# Patient Record
Sex: Male | Born: 1980 | Race: Black or African American | Hispanic: No | Marital: Single | Smoking: Never smoker
Health system: Southern US, Community
[De-identification: ages and names within clinical notes are randomized; demographics above are authoritative.]

---

## 2016-03-20 ENCOUNTER — Emergency Department (HOSPITAL_COMMUNITY): Payer: Self-pay

## 2016-03-20 ENCOUNTER — Emergency Department (HOSPITAL_COMMUNITY)
Admission: EM | Admit: 2016-03-20 | Discharge: 2016-03-20 | Disposition: A | Discharge status: Home / Self Care | Payer: Self-pay | Attending: Emergency Medicine | Admitting: Emergency Medicine

## 2016-03-20 ENCOUNTER — Encounter (HOSPITAL_COMMUNITY): Payer: Self-pay

## 2016-03-20 DIAGNOSIS — Y9289 Other specified places as the place of occurrence of the external cause: Secondary | ICD-10-CM | POA: Insufficient documentation

## 2016-03-20 DIAGNOSIS — M79671 Pain in right foot: Secondary | ICD-10-CM | POA: Insufficient documentation

## 2016-03-20 DIAGNOSIS — Y990 Civilian activity done for income or pay: Secondary | ICD-10-CM | POA: Insufficient documentation

## 2016-03-20 DIAGNOSIS — Y9371 Activity, boxing: Secondary | ICD-10-CM | POA: Insufficient documentation

## 2016-03-20 DIAGNOSIS — X509XXA Other and unspecified overexertion or strenuous movements or postures, initial encounter: Secondary | ICD-10-CM | POA: Insufficient documentation

## 2016-03-20 DIAGNOSIS — M25562 Pain in left knee: Secondary | ICD-10-CM | POA: Insufficient documentation

## 2016-03-20 MED ORDER — IBUPROFEN 800 MG PO TABS: 800.0000 mg | ORAL_TABLET | Freq: Three times a day (TID) | ORAL | 0 refills | Status: AC

## 2016-03-20 MED ORDER — ACETAMINOPHEN 500 MG PO TABS
1000.0000 mg | ORAL_TABLET | Freq: Once | ORAL | Status: AC
  Administered 2016-03-20: 1000 mg via ORAL
  Filled 2016-03-20: qty 2

## 2016-03-20 NOTE — ED Provider Notes (Addendum)
[facility]-EMERGENCY DEPT Provider Note   CSN: [id number] Arrival date & time: 03/20/16  0200  By signing my name below, I, Soijett Blue, attest that this documentation has been prepared under the direction and in the presence of Rayon Mcchristian, MD. Electronically Signed: Soijett Blue, ED Scribe. 03/20/16. 2:36 AM.    History   Chief Complaint Chief Complaint  Patient presents with  . Knee Injury  . Foot Pain    HPI Carl Cooper is a 35 y.o. male who presents to the Emergency Department complaining of left knee injury occurring 12 hours ago PTA. Pt states that he was boxing with friends when he made a sudden move and heard his left knee popped. Pt is having associated symptoms of right foot pain. He notes that he has not tried any medications for the relief of his symptoms. He denies color change, wound, rash, swelling, and any other symptoms.    The history is provided by the patient. No language interpreter was used.  Foot Pain  This is a new problem. The current episode started 12 to 24 hours ago. The problem occurs rarely. The problem has not changed since onset.Pertinent negatives include no chest pain. Nothing aggravates the symptoms. Nothing relieves the symptoms. He has tried nothing for the symptoms. The treatment provided no relief.  Knee Pain   This is a new problem. The current episode started 12 to 24 hours ago. The problem occurs rarely. The problem has not changed since onset.The pain is present in the left knee. Pertinent negatives include full range of motion. He has tried nothing for the symptoms. The treatment provided no relief.    History reviewed. No pertinent past medical history.  There are no active problems to display for this patient.   History reviewed. No pertinent surgical history.     Home Medications    Prior to Admission medications   Not on File    Family History No family history on file.  Social History Social History  Substance Use  Topics  . Smoking status: Never Smoker  . Smokeless tobacco: Never Used  . Alcohol use No     Allergies   Review of patient's allergies indicates no known allergies.   Review of Systems Review of Systems  Cardiovascular: Negative for chest pain.  Musculoskeletal: Positive for arthralgias (left knee and right foot). Negative for joint swelling.  Skin: Negative for color change, rash and wound.  All other systems reviewed and are negative.    Physical Exam Updated Vital Signs BP 130/95   Pulse (!) 125   Temp 98 F (36.7 C)   Resp 18   Ht 5\' 11"  (1.803 m)   Wt 222 lb (100.7 kg)   SpO2 97%   BMI 30.96 kg/m   Physical Exam  Constitutional: He is oriented to person, place, and time. He appears well-developed and well-nourished. No distress.  HENT:  Head: Normocephalic and atraumatic.  Mouth/Throat: Oropharynx is clear and moist. No oropharyngeal exudate.  Eyes: Conjunctivae and EOM are normal. Pupils are equal, round, and reactive to light.  Neck: Trachea normal. Neck supple. No JVD present. Carotid bruit is not present. No tracheal deviation present.  Cardiovascular: Normal rate, regular rhythm and normal heart sounds.  Exam reveals no gallop and no friction rub.   No murmur heard. Pulmonary/Chest: Effort normal and breath sounds normal. No stridor. No respiratory distress. He has no wheezes. He has no rales.  Abdominal: Soft. Bowel sounds are normal. He exhibits no distension and  no mass. There is no tenderness. There is no rebound and no guarding.  Musculoskeletal: Normal range of motion.       Right hip: Normal.       Left hip: Normal.       Left knee: Normal. He exhibits normal range of motion, no swelling, no effusion, no ecchymosis, no deformity, no laceration, no erythema, normal alignment, no LCL laxity, no bony tenderness, normal meniscus and no MCL laxity. No tenderness found. No medial joint line, no lateral joint line, no MCL, no LCL and no patellar tendon  tenderness noted.       Right ankle: He exhibits no deformity. Achilles tendon normal.       Right lower leg: Normal.       Left lower leg: Normal.  No left tibial plateau tenderness. Patella and quadriceps tendons intact. No laxity. Negative valgus or varus. Anterior and posterior drawer test negative. Intact achilles tendon. Intact plantar fascia. No right foot deformity. Pelvis stable. No fore-shortening or external rotation.   Neurological: He is alert and oriented to person, place, and time. He has normal reflexes.  DTR's intact.   Skin: Skin is warm and dry. Capillary refill takes less than 2 seconds. He is not diaphoretic.  Psychiatric: He has a normal mood and affect. His behavior is normal.  Nursing note and vitals reviewed.    ED Treatments / Results  DIAGNOSTIC STUDIES: Oxygen Saturation is 97% on RA, nl by my interpretation.    COORDINATION OF CARE: 2:32 AM Discussed treatment plan with pt at bedside which includes right knee xray, left foot xray, and pt agreed to plan.   Labs (all labs ordered are listed, but only abnormal results are displayed) Labs Reviewed - No data to display  Vitals:   03/20/16 0210  BP: 130/95  Pulse: (!) 125  Resp: 18  Temp: 98 F (36.7 C)    Radiology Dg Knee Complete 4 Views Left  Result Date: 03/20/2016 CLINICAL DATA:  Hurt left knee in a fall. EXAM: LEFT KNEE - COMPLETE 4+ VIEW COMPARISON:  None. 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 No evidence of acute fracture or dislocation in the left knee. No focal bone lesions. No significant effusion. 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  No acute bony abnormalities. Electronically Signed   By: Burman Nieves M.D.   On: 03/20/2016 02:36   Dg Foot Complete Right  Result Date: 03/20/2016 CLINICAL DATA:  Hurt right foot in a fall. EXAM: RIGHT FOOT COMPLETE - 3+ VIEW COMPARISON:  None. FINDINGS: There is no evidence of  fracture or dislocation. There is no evidence of arthropathy or other focal bone abnormality. Soft tissues are unremarkable. IMPRESSION: Negative. Electronically Signed   By: Burman Nieves M.D.   On: 03/20/2016 02:38    Procedures Procedures (including critical care time)  Medications Ordered in ED Medications - No data to display   Initial Impression / Assessment and Plan / ED Course  I have reviewed the triage vital signs and the nursing notes.  Pertinent imaging results that were available during my care of the patient were reviewed by me and considered in my medical decision making (see chart for details).  Clinical Course     Final Clinical Impressions(s) / ED Diagnoses   Final diagnoses:  None    New Prescriptions New Prescriptions   No medications on file   All questions answered to patient's satisfaction. Based on history and exam patient has been  appropriately medically screened and emergency conditions excluded. Patient is stable for discharge at this time. Follow up with your PMD for recheck in 2 days and strict return precautions given.   I personally performed the services described in this documentation, which was scribed in my presence. The recorded information has been reviewed and is accurate.       Cy BlamerApril Pharaoh Pio, MD 03/20/16 0245    Loa Idler, MD 03/20/16 [id number]

## 2016-03-20 NOTE — ED Triage Notes (Signed)
Pt states that he was playing around with some friends at work and hurt his L knee and R  Foot. Able to move extremities without difficulty, no swelling noted.

## 2018-01-25 IMAGING — DX DG FOOT COMPLETE 3+V*R*
3 series · 3 of 3 positions shown · non-contrast
Comparison: None.

CLINICAL DATA: Hurt right foot in a fall.

EXAM:
RIGHT FOOT COMPLETE - 3+ VIEW

[foot ap]
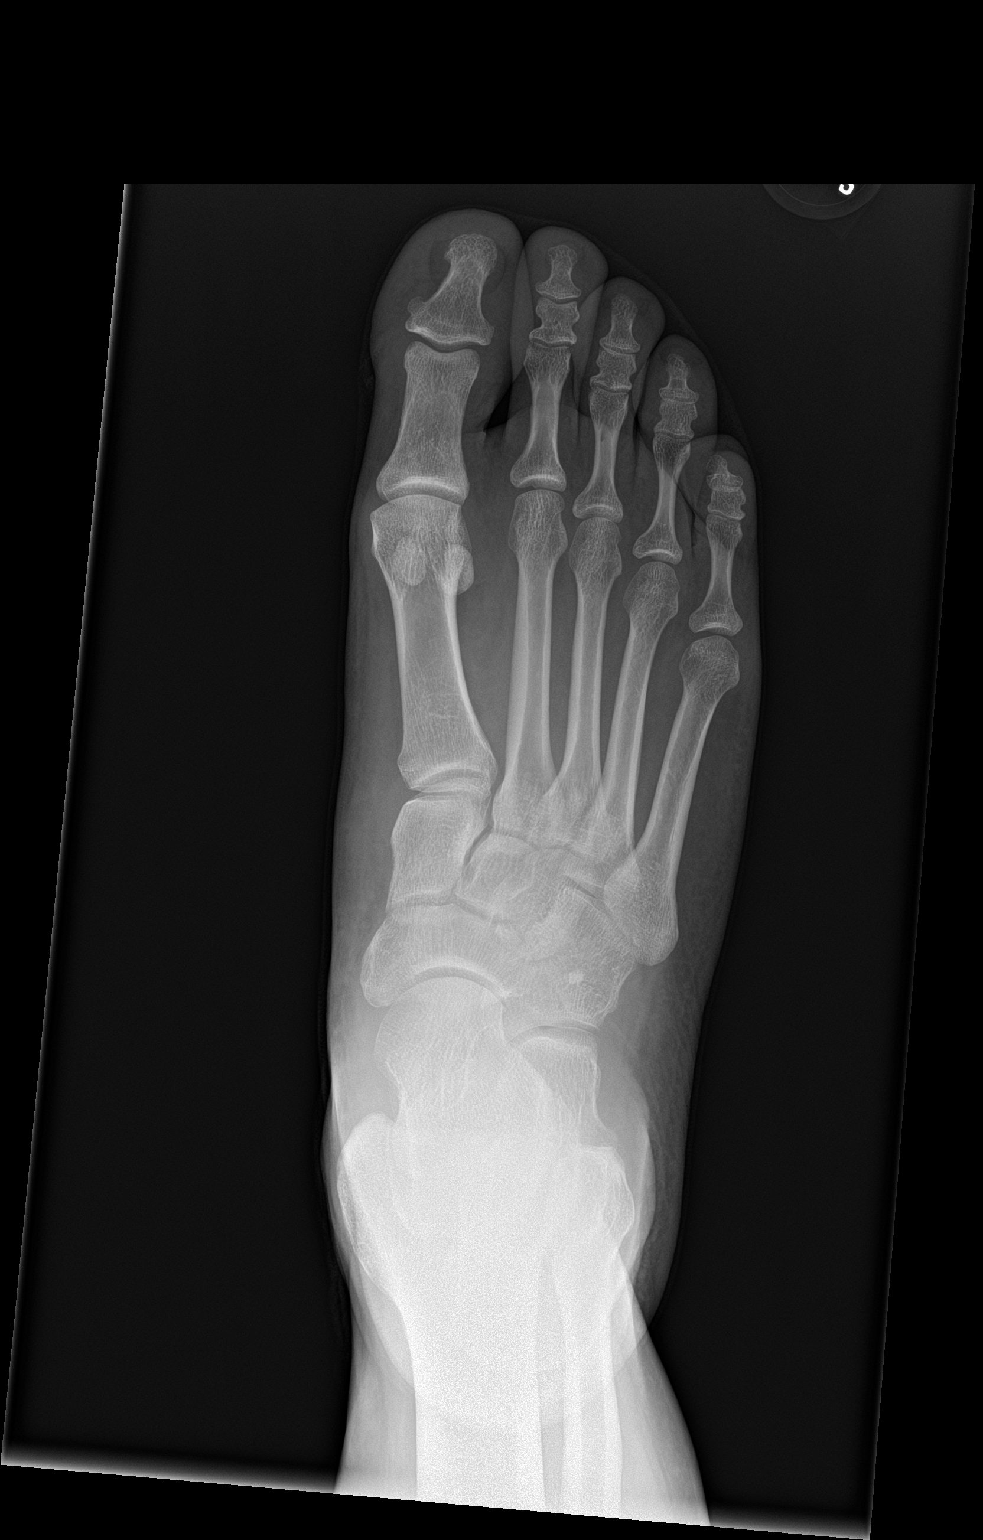

[foot obl]
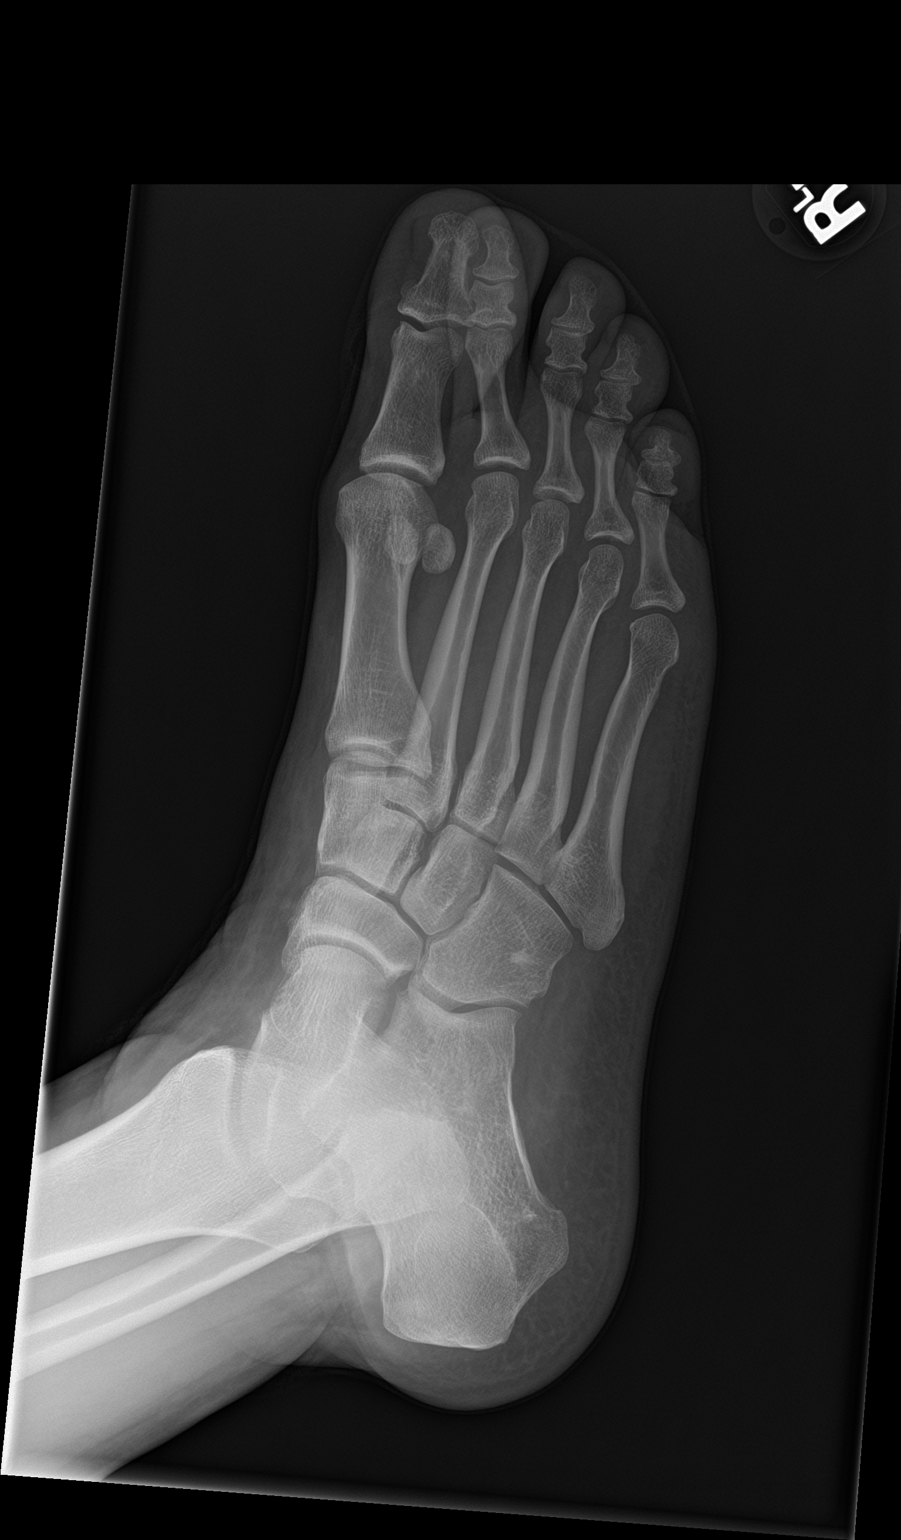

[foot lat]
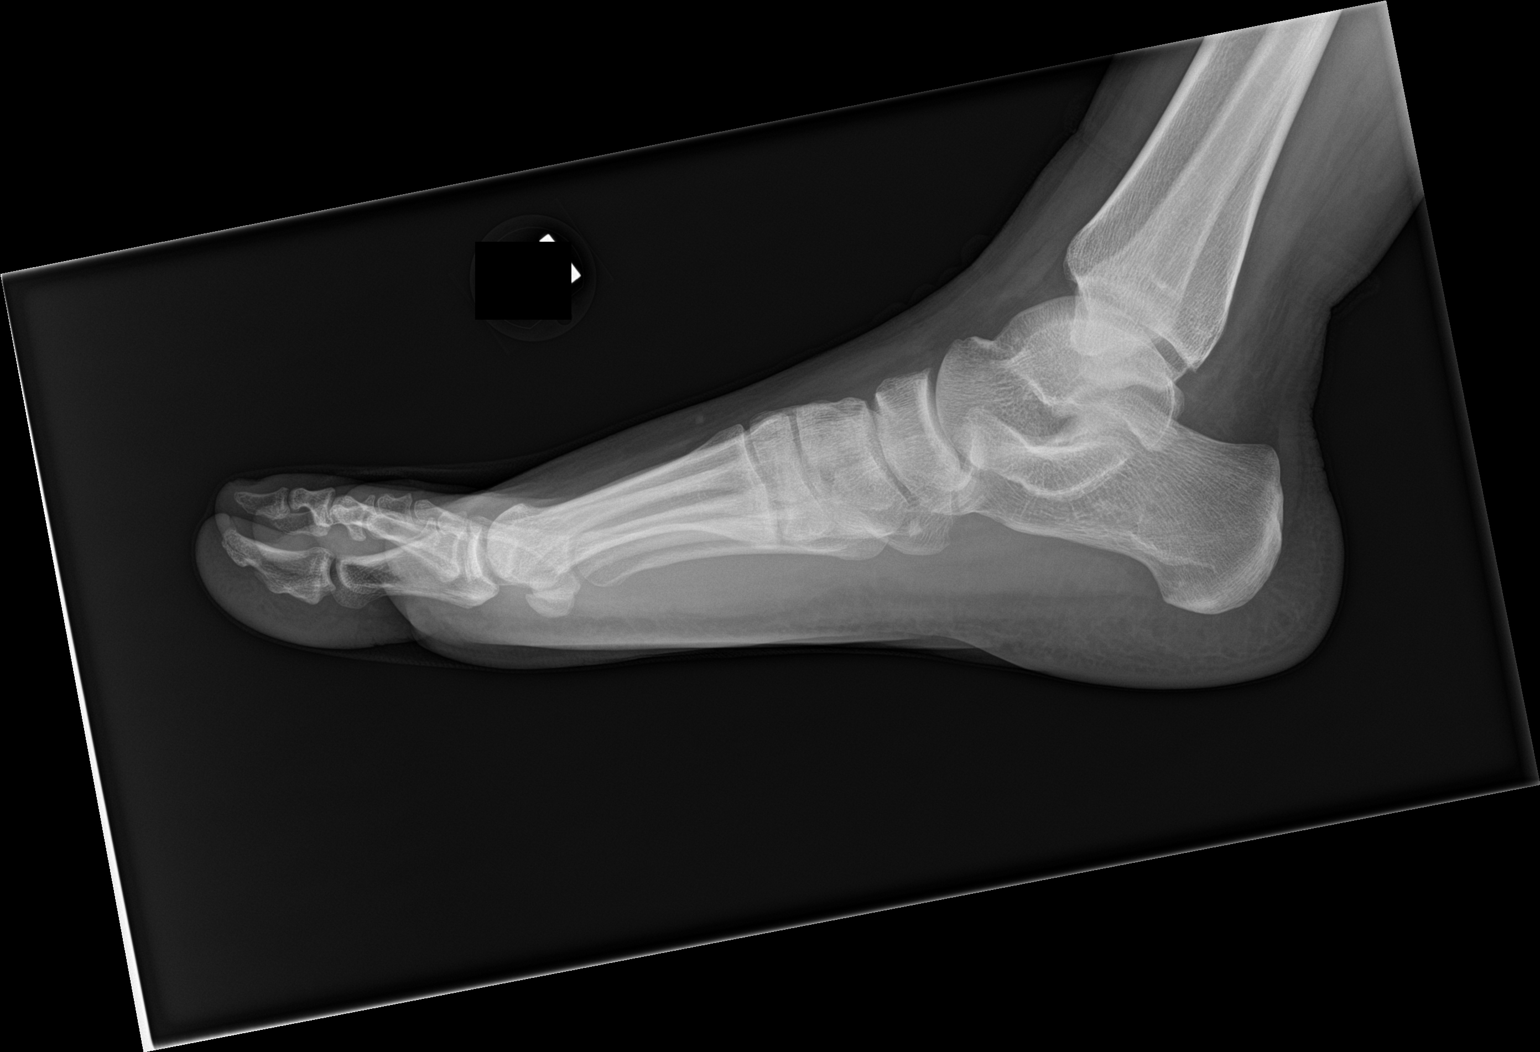

[3 of 3 positions shown; findings below may reference images not displayed]

FINDINGS: There is no evidence of fracture or dislocation. There is no
evidence of arthropathy or other focal bone abnormality. Soft
tissues are unremarkable.
IMPRESSION: Negative.
